# Patient Record
Sex: Female | Born: 1950 | Race: White | Hispanic: No | Marital: Married | Smoking: Current every day smoker
Health system: Southern US, Community
[De-identification: ages and names within clinical notes are randomized; demographics above are authoritative.]

## PROBLEM LIST (undated history)

## (undated) DIAGNOSIS — R569 Unspecified convulsions: Secondary | ICD-10-CM

## (undated) HISTORY — PX: KNEE SURGERY: SHX244

---

## 2017-11-09 ENCOUNTER — Emergency Department (HOSPITAL_COMMUNITY)
Admission: EM | Admit: 2017-11-09 | Discharge: 2017-11-09 | Disposition: A | Discharge status: Home / Self Care | Payer: Medicare Other | Attending: Emergency Medicine | Admitting: Emergency Medicine

## 2017-11-09 ENCOUNTER — Emergency Department (HOSPITAL_COMMUNITY): Payer: Medicare Other

## 2017-11-09 ENCOUNTER — Encounter (HOSPITAL_COMMUNITY): Payer: Self-pay | Admitting: Emergency Medicine

## 2017-11-09 ENCOUNTER — Other Ambulatory Visit: Payer: Self-pay

## 2017-11-09 DIAGNOSIS — Y999 Unspecified external cause status: Secondary | ICD-10-CM | POA: Diagnosis not present

## 2017-11-09 DIAGNOSIS — Y939 Activity, unspecified: Secondary | ICD-10-CM | POA: Diagnosis not present

## 2017-11-09 DIAGNOSIS — Y929 Unspecified place or not applicable: Secondary | ICD-10-CM | POA: Diagnosis not present

## 2017-11-09 DIAGNOSIS — T07XXXA Unspecified multiple injuries, initial encounter: Secondary | ICD-10-CM

## 2017-11-09 DIAGNOSIS — W108XXA Fall (on) (from) other stairs and steps, initial encounter: Secondary | ICD-10-CM | POA: Diagnosis not present

## 2017-11-09 DIAGNOSIS — S0990XA Unspecified injury of head, initial encounter: Secondary | ICD-10-CM | POA: Insufficient documentation

## 2017-11-09 DIAGNOSIS — W19XXXA Unspecified fall, initial encounter: Secondary | ICD-10-CM

## 2017-11-09 LAB — CBC
HCT: 35.6 % — ABNORMAL LOW (ref 36.0–46.0)
HEMOGLOBIN: 11.6 g/dL — AB (ref 12.0–15.0)
MCH: 28.6 pg (ref 26.0–34.0)
MCHC: 32.6 g/dL (ref 30.0–36.0)
MCV: 87.7 fL (ref 78.0–100.0)
Platelets: 223 10*3/uL (ref 150–400)
RBC: 4.06 MIL/uL (ref 3.87–5.11)
RDW: 17.8 % — ABNORMAL HIGH (ref 11.5–15.5)
WBC: 7.5 10*3/uL (ref 4.0–10.5)

## 2017-11-09 LAB — CBG MONITORING, ED: Glucose-Capillary: 83 mg/dL (ref 65–99)

## 2017-11-09 LAB — BASIC METABOLIC PANEL
ANION GAP: 7 (ref 5–15)
BUN: 13 mg/dL (ref 6–20)
CHLORIDE: 103 mmol/L (ref 101–111)
CO2: 26 mmol/L (ref 22–32)
Calcium: 8.5 mg/dL — ABNORMAL LOW (ref 8.9–10.3)
Creatinine, Ser: 0.6 mg/dL (ref 0.44–1.00)
GFR calc non Af Amer: >60 mL/min (ref >60)
Glucose, Bld: 94 mg/dL (ref 65–99)
Potassium: 3.5 mmol/L (ref 3.5–5.1)
Sodium: 136 mmol/L (ref 135–145)

## 2017-11-09 MED ORDER — MORPHINE SULFATE (PF) 4 MG/ML IV SOLN
4.0000 mg | Freq: Once | INTRAVENOUS | Status: AC
  Administered 2017-11-09: 4 mg via INTRAVENOUS
  Filled 2017-11-09: qty 1

## 2017-11-09 NOTE — ED Notes (Signed)
Patient transported to CT 

## 2017-11-09 NOTE — ED Notes (Signed)
Attempted blood draw without success

## 2017-11-09 NOTE — Discharge Instructions (Signed)
You can take tylenol or motrin for pain. Follow-up with your orthopedist if ongoing issues with your shoulder. Can also follow-up with your primary care doctor. Return to the ED for new or worsening symptoms.

## 2017-11-09 NOTE — ED Notes (Signed)
Pt given taxi voucher d/t EMS arrival without wallet or other transportation. Pt departed in NAD.
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
## 2017-11-09 NOTE — ED Notes (Signed)
Patient transported to X-ray 

## 2017-11-09 NOTE — ED Triage Notes (Signed)
Per [facility], pt arrives from home after slipping on front porch steps and falling onto asphault. Pt reports loss of consciousness for 5 minutes. Pt is on plavix. Pt arrives in c-collar and sling to left arm. EMS reports bump to occipital region and bruising to left shoulder and clavicle. EMS reports nausea and gave 4 mg of zofran. EMS gave 150 mcg of fentanyl.

## 2018-05-02 ENCOUNTER — Emergency Department (HOSPITAL_BASED_OUTPATIENT_CLINIC_OR_DEPARTMENT_OTHER): Payer: Medicare Other

## 2018-05-02 ENCOUNTER — Encounter (HOSPITAL_BASED_OUTPATIENT_CLINIC_OR_DEPARTMENT_OTHER): Payer: Self-pay | Admitting: Emergency Medicine

## 2018-05-02 ENCOUNTER — Other Ambulatory Visit: Payer: Self-pay

## 2018-05-02 ENCOUNTER — Emergency Department (HOSPITAL_BASED_OUTPATIENT_CLINIC_OR_DEPARTMENT_OTHER)
Admission: EM | Admit: 2018-05-02 | Discharge: 2018-05-02 | Disposition: A | Discharge status: Home / Self Care | Payer: Medicare Other | Attending: Emergency Medicine | Admitting: Emergency Medicine

## 2018-05-02 DIAGNOSIS — S2231XA Fracture of one rib, right side, initial encounter for closed fracture: Secondary | ICD-10-CM | POA: Diagnosis not present

## 2018-05-02 DIAGNOSIS — Y9389 Activity, other specified: Secondary | ICD-10-CM | POA: Diagnosis not present

## 2018-05-02 DIAGNOSIS — F17210 Nicotine dependence, cigarettes, uncomplicated: Secondary | ICD-10-CM | POA: Insufficient documentation

## 2018-05-02 DIAGNOSIS — Y929 Unspecified place or not applicable: Secondary | ICD-10-CM | POA: Diagnosis not present

## 2018-05-02 DIAGNOSIS — W01190A Fall on same level from slipping, tripping and stumbling with subsequent striking against furniture, initial encounter: Secondary | ICD-10-CM | POA: Insufficient documentation

## 2018-05-02 DIAGNOSIS — Y998 Other external cause status: Secondary | ICD-10-CM | POA: Insufficient documentation

## 2018-05-02 DIAGNOSIS — Z79899 Other long term (current) drug therapy: Secondary | ICD-10-CM | POA: Insufficient documentation

## 2018-05-02 DIAGNOSIS — S299XXA Unspecified injury of thorax, initial encounter: Secondary | ICD-10-CM | POA: Diagnosis present

## 2018-05-02 DIAGNOSIS — Z7902 Long term (current) use of antithrombotics/antiplatelets: Secondary | ICD-10-CM | POA: Insufficient documentation

## 2018-05-02 MED ORDER — OXYCODONE-ACETAMINOPHEN 5-325 MG PO TABS: 1.0000 | ORAL_TABLET | Freq: Four times a day (QID) | ORAL | 0 refills | Status: DC | PRN

## 2018-05-02 MED ORDER — OXYCODONE-ACETAMINOPHEN 5-325 MG PO TABS
1.0000 | ORAL_TABLET | Freq: Once | ORAL | Status: AC
Start: 2018-05-02 — End: 2018-05-02
  Administered 2018-05-02: 1 via ORAL
  Filled 2018-05-02: qty 1

## 2018-05-02 MED ORDER — IPRATROPIUM-ALBUTEROL 0.5-2.5 (3) MG/3ML IN SOLN
3.0000 mL | Freq: Once | RESPIRATORY_TRACT | Status: AC
  Administered 2018-05-02: 3 mL via RESPIRATORY_TRACT
  Filled 2018-05-02: qty 3

## 2018-05-02 MED ORDER — ALBUTEROL SULFATE (2.5 MG/3ML) 0.083% IN NEBU
2.5000 mg | INHALATION_SOLUTION | Freq: Once | RESPIRATORY_TRACT | Status: AC
  Administered 2018-05-02: 2.5 mg via RESPIRATORY_TRACT
  Filled 2018-05-02: qty 3

## 2018-05-02 NOTE — ED Notes (Signed)
Provider at the bedside.  

## 2018-05-02 NOTE — ED Notes (Signed)
Updated pt to wait time 

## 2018-05-02 NOTE — ED Notes (Signed)
Pt/family verbalized understanding of discharge instructions.   

## 2018-05-02 NOTE — ED Triage Notes (Signed)
Pt states she fell on Saturday. She states she stumbled because she is wearing a knee immobilizer. C/o R rib pain.

## 2018-05-02 NOTE — Discharge Instructions (Addendum)
Get help right away if: °You have a fever. °You have difficulty breathing or shortness of breath. °You develop a continual cough, or you cough up thick or bloody sputum. °You feel sick to your stomach (nausea), throw up (vomit), or have abdominal pain. °You have worsening pain not controlled with medications. °
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
## 2019-06-29 IMAGING — CR DG RIBS W/ CHEST 3+V*L*
4 series · 4 of 4 positions shown · non-contrast
Comparison: None

CLINICAL DATA: 67-year-old female with fall and back pain.

EXAM:
THORACIC SPINE 2 VIEWS; LEFT RIBS AND CHEST - 3+ VIEW

[rib ap]
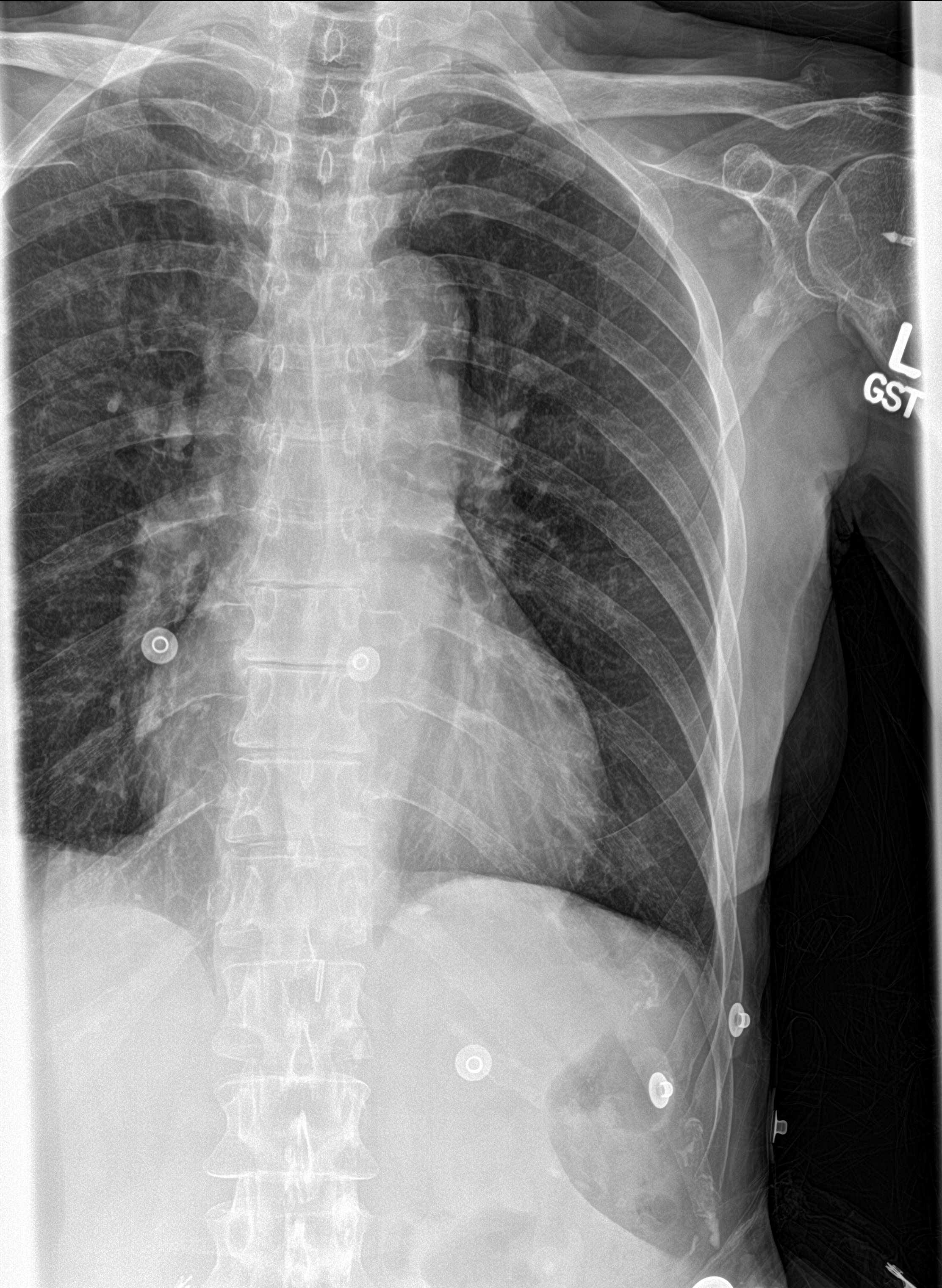

[rib ap obl (1 of 2)]
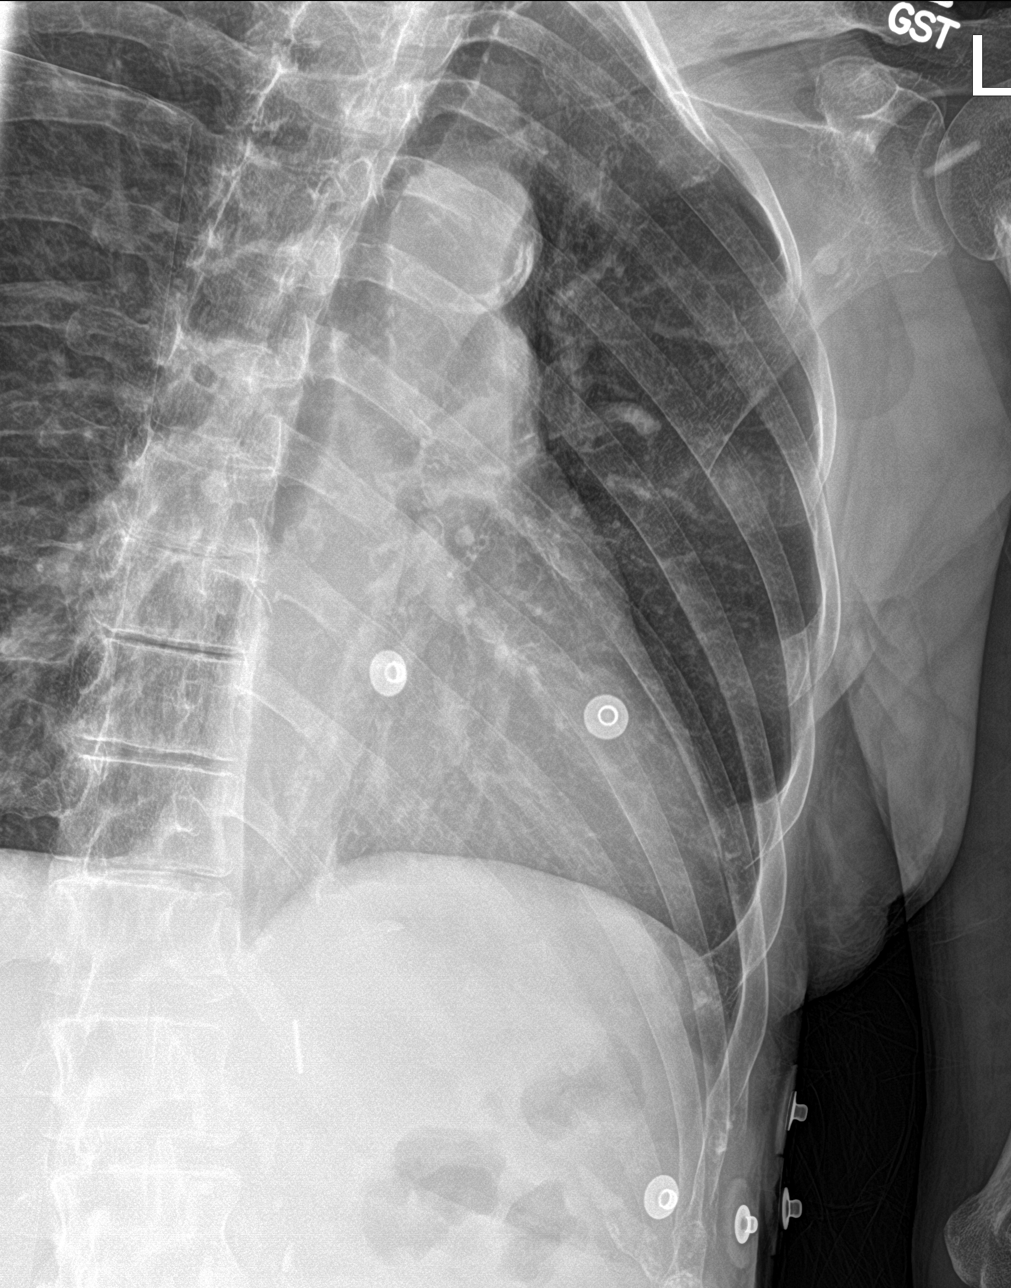

[chest ap]
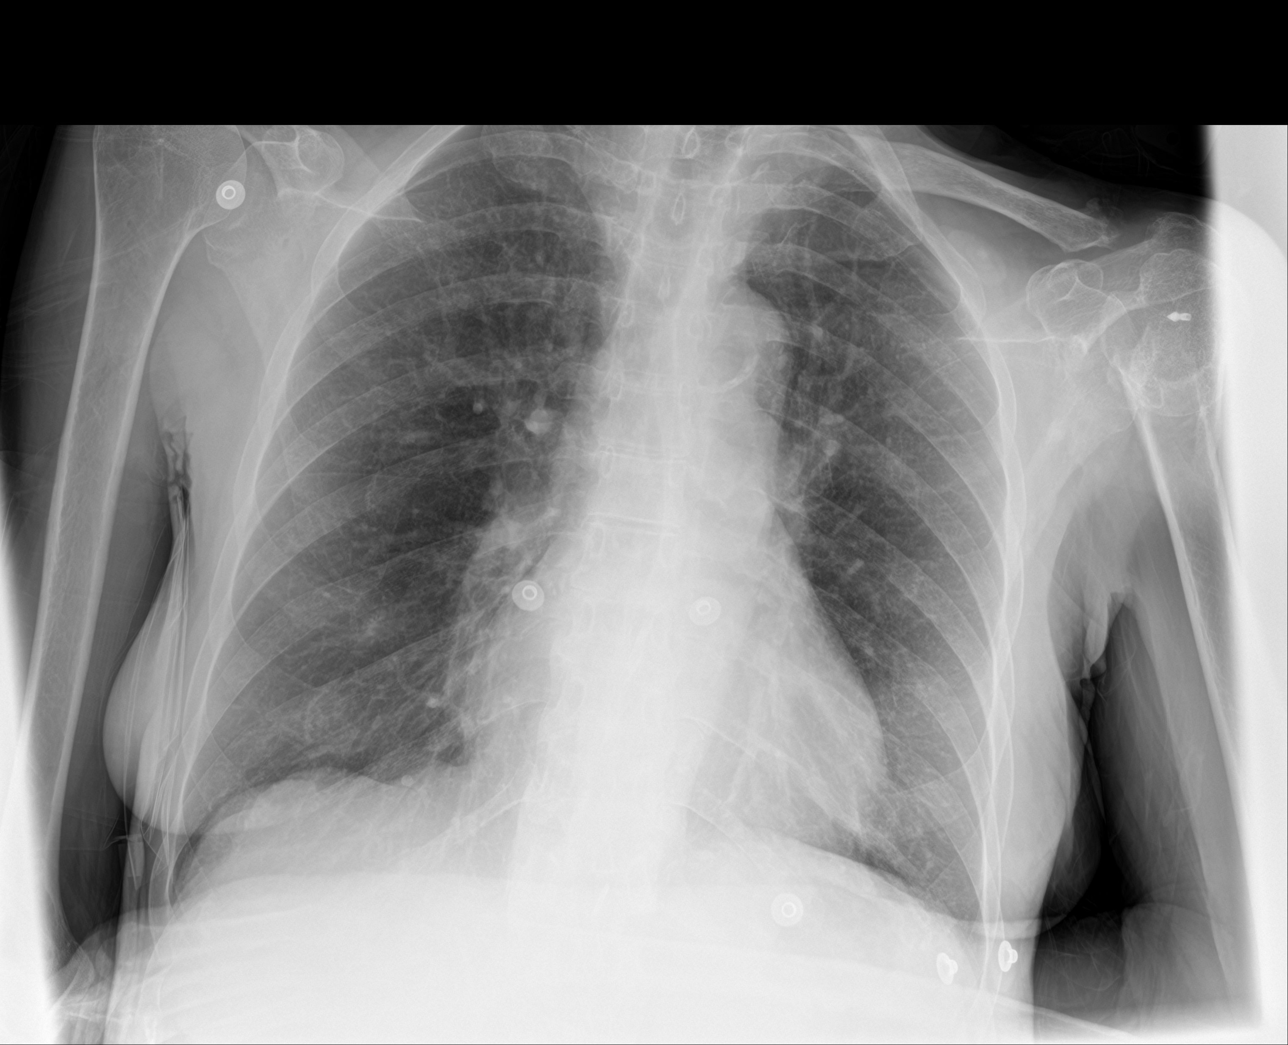

[rib ap obl (2 of 2)]
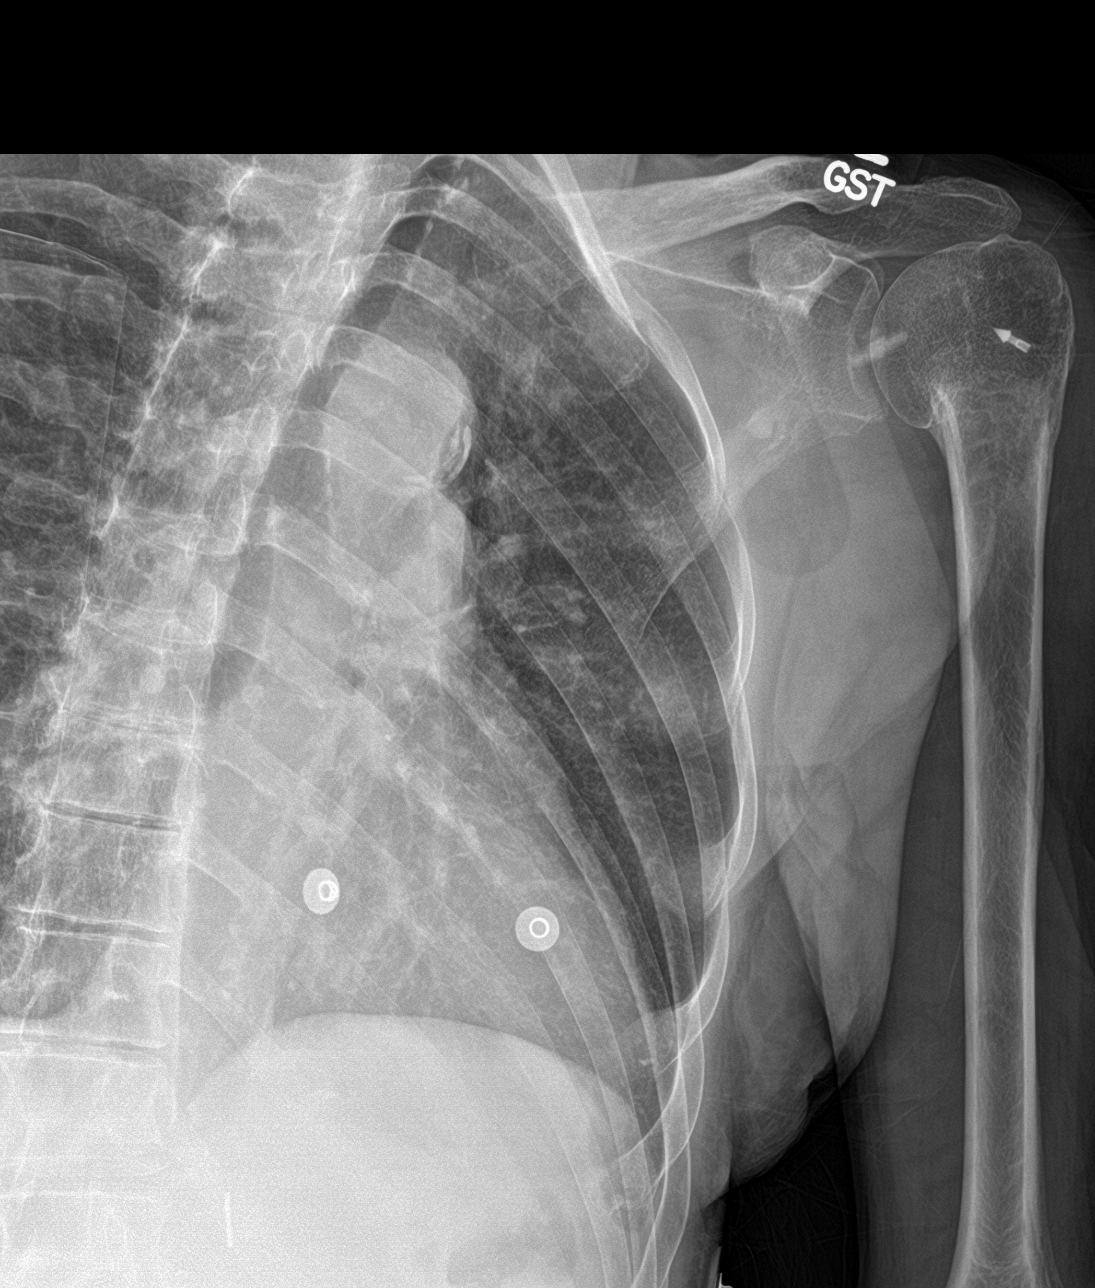

[4 of 4 positions shown; findings below may reference images not displayed]

FINDINGS: The lungs are clear. There is no pleural effusion or pneumothorax.
The cardiac silhouette is within normal limits. There is
atherosclerotic calcification of the aortic arch. There is
osteopenia with degenerative changes of the spine. No acute osseous
pathology identified. No acute thoracic spine or left rib fracture.
IMPRESSION: 1. No acute cardiopulmonary process.
2. No acute fracture.

## 2020-01-22 ENCOUNTER — Emergency Department (HOSPITAL_BASED_OUTPATIENT_CLINIC_OR_DEPARTMENT_OTHER): Payer: Medicare (Managed Care)

## 2020-01-22 ENCOUNTER — Emergency Department (HOSPITAL_BASED_OUTPATIENT_CLINIC_OR_DEPARTMENT_OTHER)
Admission: EM | Admit: 2020-01-22 | Discharge: 2020-01-22 | Disposition: A | Discharge status: Home / Self Care | Payer: Medicare (Managed Care) | Attending: Emergency Medicine | Admitting: Emergency Medicine

## 2020-01-22 ENCOUNTER — Encounter (HOSPITAL_BASED_OUTPATIENT_CLINIC_OR_DEPARTMENT_OTHER): Payer: Self-pay | Admitting: *Deleted

## 2020-01-22 ENCOUNTER — Other Ambulatory Visit: Payer: Self-pay

## 2020-01-22 DIAGNOSIS — L249 Irritant contact dermatitis, unspecified cause: Secondary | ICD-10-CM

## 2020-01-22 DIAGNOSIS — M79602 Pain in left arm: Secondary | ICD-10-CM | POA: Diagnosis present

## 2020-01-22 DIAGNOSIS — L03114 Cellulitis of left upper limb: Secondary | ICD-10-CM

## 2020-01-22 DIAGNOSIS — F17210 Nicotine dependence, cigarettes, uncomplicated: Secondary | ICD-10-CM | POA: Diagnosis not present

## 2020-01-22 MED ORDER — TRIAMCINOLONE ACETONIDE 0.1 % EX CREA: 1.0000 "application " | TOPICAL_CREAM | Freq: Two times a day (BID) | CUTANEOUS | 0 refills | Status: AC

## 2020-01-22 MED ORDER — HYDROCODONE-ACETAMINOPHEN 5-325 MG PO TABS
1.0000 | ORAL_TABLET | Freq: Once | ORAL | Status: AC
  Administered 2020-01-22: 1 via ORAL
  Filled 2020-01-22: qty 1

## 2020-01-22 MED ORDER — CEPHALEXIN 500 MG PO CAPS: 500.0000 mg | ORAL_CAPSULE | Freq: Four times a day (QID) | ORAL | 0 refills | Status: AC

## 2020-01-22 MED ORDER — HYDROCODONE-ACETAMINOPHEN 5-325 MG PO TABS: 1.0000 | ORAL_TABLET | ORAL | 0 refills | Status: DC | PRN

## 2020-01-22 NOTE — ED Notes (Signed)
Pt discharged to home. Discharge instructions have been discussed with patient and/or family members. Pt verbally acknowledges understanding d/c instructions, and endorses comprehension to checkout at registration before leaving.  °

## 2020-01-22 NOTE — Discharge Instructions (Addendum)
As we discussed, there may be some contact dermatitis from Neosporin.  We will plan to put you on a topical steroid.  Do not put the steroid cream over the wound.  Additionally, there may be some superficial skin infection. Take antibiotics as directed. Please take all of your antibiotics until finished.  Return the emergency department for any fever, redness or swelling that began spreading, difficulty moving the arm or leg worsening or concerning symptoms.
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
## 2020-01-22 NOTE — ED Provider Notes (Signed)
[facility] EMERGENCY DEPARTMENT Provider Note   CSN: [id number] Arrival date & time: 01/22/20  1517     History Chief Complaint  Patient presents with  . Arm Injury    Kimberly Roberson is a 69 y.o. female who complains of 1 week of redness, swelling, pain noted to her left upper extremity.  She reports that about a week ago, she was helping her husband and states that her arm got caught in between the door.  She thinks that 1 part of the door scratched her arm.  She reports she started having some bruising, swelling to the area.  She reports that over the last few days, it is gotten red, warm to the touch and she has noted some blisters.  She states that it hurts more when she tries to move it.  She has not noted any fevers.  Denies any numbness/weakness.  She is not on any blood thinners. No prior history of DVTs.   The history is provided by the patient.       History reviewed. No pertinent past medical history.  There are no problems to display for this patient.   History reviewed. No pertinent surgical history.   OB History   No obstetric history on file.     No family history on file.  Social History   Tobacco Use  . Smoking status: Current Every Day Smoker    Packs/day: 0.50    Types: Cigarettes  . Smokeless tobacco: Never Used  Substance Use Topics  . Alcohol use: Not on file  . Drug use: Never    Home Medications Prior to Admission medications   Medication Sig Start Date End Date Taking? Authorizing Provider  atorvastatin (LIPITOR) 10 MG tablet Take 10 mg by mouth daily. 10/19/17   [provider]  carbamazepine (TEGRETOL) 200 MG tablet Take 200-400 mg by mouth See admin instructions. Take 1 tablet every morning and take 2 tablets every evening 08/09/14   [provider]  cephALEXin (KEFLEX) 500 MG capsule Take 1 capsule (500 mg total) by mouth 4 (four) times daily for 7 days. 01/22/20 01/29/20  Maxwell Caul, PA-C  clopidogrel  (PLAVIX) 75 MG tablet Take 75 mg by mouth daily. 10/10/17   [provider]  divalproex (DEPAKOTE) 500 MG DR tablet Take 1,000-1,500 mg by mouth See admin instructions. Take 2 tablets every morning and take 3 tablets every evening 10/09/12   [provider]  HYDROcodone-acetaminophen (NORCO/VICODIN) 5-325 MG tablet Take 1-2 tablets by mouth every 4 (four) hours as needed. 01/22/20   Maxwell Caul, PA-C  oxyCODONE-acetaminophen (PERCOCET) 5-325 MG tablet Take 1-2 tablets by mouth every 6 (six) hours as needed for severe pain. 05/02/18   Arthor Captain, PA-C  promethazine (PHENERGAN) 25 MG tablet Take 25 mg by mouth 3 (three) times daily as needed for nausea.  11/08/17   [provider]  triamcinolone cream (KENALOG) 0.1 % Apply 1 application topically 2 (two) times daily. 01/22/20   Maxwell Caul, PA-C    Allergies    Bee venom, Demerol  [meperidine hcl], Meperidine, Morphine and related, Sulfa antibiotics, Clarithromycin, and Codeine  Review of Systems   Review of Systems  Constitutional: Negative for fever.  Respiratory: Negative for shortness of breath.   Cardiovascular: Negative for chest pain.  Gastrointestinal: Negative for abdominal pain, nausea and vomiting.  Skin: Positive for color change and wound.  Neurological: Negative for weakness and numbness.  All other systems reviewed and are negative.  Physical Exam Updated Vital Signs BP (!) 141/60 (BP Location: Right Arm)   Pulse 93   Temp 98.6 F (37 C) (Oral)   Resp 19   Wt 54.9 kg   SpO2 100%   BMI 20.15 kg/m   Physical Exam Vitals and nursing note reviewed.  Constitutional:      Appearance: She is well-developed.  HENT:     Head: Normocephalic and atraumatic.  Eyes:     General: No scleral icterus.       Right eye: No discharge.        Left eye: No discharge.     Conjunctiva/sclera: Conjunctivae normal.  Cardiovascular:     Pulses:          Radial pulses are 2+ on the right side and  2+ on the left side.  Pulmonary:     Effort: Pulmonary effort is normal.  Musculoskeletal:     Comments: Edema, warmth, erythema, induration and ecchymosis noted to the anterior aspect of the left forearm/elbow, particular at the antecubital fossa.  She has a small 1 cm scabbed over wound.  No active drainage.  No fluctuance.  Flexion/extension of left elbow intact but with some subjective pain.  No bony tenderness noted to the left elbow joint itself.  Elbow joint is without any warmth, erythema.  Compartments are soft. No bony tenderness of the left shoulder, left forearm, left wrist.  Skin:    General: Skin is warm and dry.     Capillary Refill: Capillary refill takes less than 2 seconds.     Comments: Diffuse erythematous, maculopapular rash in the anterior aspect of left upper extremity. Good distal cap refill.  RUE is not dusky in appearance or cool to touch.  Neurological:     Mental Status: She is alert.     Comments: Sensation intact along major nerve distributions of BUE  Psychiatric:        Speech: Speech normal.        Behavior: Behavior normal.             ED Results / Procedures / Treatments   Labs (all labs ordered are listed, but only abnormal results are displayed) Labs Reviewed  BASIC METABOLIC PANEL  CBC WITH DIFFERENTIAL/PLATELET    EKG None  Radiology DG Elbow Complete Left  Result Date: 01/22/2020 CLINICAL DATA:  Left arm injury, left elbow swelling EXAM: LEFT ELBOW - COMPLETE 3+ VIEW COMPARISON:  None. FINDINGS: There is no evidence of fracture, dislocation, or joint effusion. There is no evidence of arthropathy or other focal bone abnormality. Soft tissues are unremarkable. IMPRESSION: Negative. Electronically Signed   By: Helyn Numbers MD   On: 01/22/2020 16:16    Procedures Procedures (including critical care time)  Medications Ordered in ED Medications  HYDROcodone-acetaminophen (NORCO/VICODIN) 5-325 MG per tablet 1 tablet (1 tablet Oral  Given 01/22/20 1642)    ED Course  I have reviewed the triage vital signs and the nursing notes.  Pertinent labs & imaging results that were available during my care of the patient were reviewed by me and considered in my medical decision making (see chart for details).    MDM Rules/Calculators/A&P                          69 year old female who presents for evaluation of pain, redness, swelling of the left upper extremity.  She reports that she got it caught in a door but a week ago.  Since then, has had symptoms.  No fevers.  She reports that she broke out into a rash a few days ago, prompting ED visit.  Initially arrival, she is afebrile, nontoxic-appearing.  Vital signs are stable.  She is neurovascularly intact.  On exam, she has diffuse warmth, erythema, edema to the anterior aspect of her left forearm/elbow area at the antecubital fossa.  There is a small wound that is scabbed over.  No active drainage.  No fluctuance concerning for abscess.  Additionally, she has a diffuse erythematous, maculopapular rash.  Consider cellulitis with contact dermatitis.  History/physical exam not concerning for shingles, ischemic limb, DVT of upper extremity, septic arthritis.  We will plan for x-ray for evaluation of any traumatic bony abnormality.  Elbow x-ray shows no evidence of fracture dislocation or joint effusion.  Discussed with Dr. Madilyn Hook who evaluated patient.  We will plan to treat as contact dermatitis triamcinolone as well as cellulitis.  Updated patient on plan.  She is agreeable.  She is requesting pain medication help with pain to go to sleep.  Patient reviewed on PMP.  She does have recent narcotic prescription on 12/12/2019.  None since then. At this time, patient exhibits no emergent life-threatening condition that require further evaluation in ED or admission. Patient had ample opportunity for questions and discussion. All patient's questions were answered with full understanding. Strict return  precautions discussed. Patient expresses understanding and agreement to plan.   Portions of this note were generated with Scientist, clinical (histocompatibility and immunogenetics). Dictation errors may occur despite best attempts at proofreading.  Final Clinical Impression(s) / ED Diagnoses Final diagnoses:  Cellulitis of left upper extremity  Irritant contact dermatitis, unspecified trigger    Rx / DC Orders ED Discharge Orders         Ordered    triamcinolone cream (KENALOG) 0.1 %  2 times daily     Discontinue  Reprint     01/22/20 1711    cephALEXin (KEFLEX) 500 MG capsule  4 times daily     Discontinue  Reprint     01/22/20 1711    HYDROcodone-acetaminophen (NORCO/VICODIN) 5-325 MG tablet  Every 4 hours PRN     Discontinue  Reprint     01/22/20 1711           Maxwell Caul, PA-C 01/22/20 1719    Tilden Fossa, MD 01/23/20 [phone]

## 2020-01-22 NOTE — ED Notes (Signed)
IV attempt x 1 right forearm unsuccessful, RN & MD aware.

## 2020-01-22 NOTE — ED Notes (Signed)
EDP Madilyn Hook canceling labs, and IV d/t pt being difficult stick. 4 unsuccessful IV attempts. 1 attempt by Augusto Gamble, RN,  2 attempts by Janna Arch, RN, and 1 attempt by Gwenlyn Perking, RT

## 2020-01-31 ENCOUNTER — Emergency Department (HOSPITAL_BASED_OUTPATIENT_CLINIC_OR_DEPARTMENT_OTHER): Payer: Medicare (Managed Care)

## 2020-01-31 ENCOUNTER — Emergency Department (HOSPITAL_BASED_OUTPATIENT_CLINIC_OR_DEPARTMENT_OTHER)
Admission: EM | Admit: 2020-01-31 | Discharge: 2020-01-31 | Disposition: A | Discharge status: Home / Self Care | Payer: Medicare (Managed Care) | Attending: Emergency Medicine | Admitting: Emergency Medicine

## 2020-01-31 ENCOUNTER — Other Ambulatory Visit: Payer: Self-pay

## 2020-01-31 ENCOUNTER — Encounter (HOSPITAL_BASED_OUTPATIENT_CLINIC_OR_DEPARTMENT_OTHER): Payer: Self-pay

## 2020-01-31 DIAGNOSIS — M25561 Pain in right knee: Secondary | ICD-10-CM

## 2020-01-31 DIAGNOSIS — W19XXXA Unspecified fall, initial encounter: Secondary | ICD-10-CM | POA: Insufficient documentation

## 2020-01-31 DIAGNOSIS — F17210 Nicotine dependence, cigarettes, uncomplicated: Secondary | ICD-10-CM | POA: Insufficient documentation

## 2020-01-31 HISTORY — DX: Unspecified convulsions: R56.9

## 2020-01-31 MED ORDER — PROMETHAZINE HCL 25 MG/ML IJ SOLN: 6.2500 mg | Freq: Once | INTRAMUSCULAR | Status: DC

## 2020-01-31 MED ORDER — OXYCODONE-ACETAMINOPHEN 5-325 MG PO TABS
2.0000 | ORAL_TABLET | Freq: Once | ORAL | Status: AC
  Administered 2020-01-31: 2 via ORAL
  Filled 2020-01-31: qty 2

## 2020-01-31 MED ORDER — OXYCODONE-ACETAMINOPHEN 5-325 MG PO TABS: 1.0000 | ORAL_TABLET | Freq: Four times a day (QID) | ORAL | 0 refills | Status: DC | PRN

## 2020-01-31 MED ORDER — HYDROMORPHONE HCL 1 MG/ML IJ SOLN
1.0000 mg | Freq: Once | INTRAMUSCULAR | Status: AC
  Administered 2020-01-31: 1 mg via INTRAVENOUS
  Filled 2020-01-31: qty 1

## 2020-01-31 NOTE — ED Provider Notes (Signed)
[facility] EMERGENCY DEPARTMENT Provider Note   CSN: [id number] Arrival date & time: 01/31/20  1221     History Chief Complaint  Patient presents with   Knee Injury    Kimberly Roberson is a 69 y.o. female w/ hx of tibial plateau fracture in 2020 s/p plate repair and fixation, presenting to the ED with right knee pain.  She reports she lost her balance yesterday evening and fell from standing directly onto her right knee.  She had severe pain overnight.  She presents in 10/10 pain, the worst near her right knee at the site of plate fixation.  She denies other injures.  HPI     Past Medical History:  Diagnosis Date   Seizures (HCC)     There are no problems to display for this patient.   Past Surgical History:  Procedure Laterality Date   CESAREAN SECTION     KNEE SURGERY       OB History   No obstetric history on file.     No family history on file.  Social History   Tobacco Use   Smoking status: Current Every Day Smoker    Packs/day: 0.50    Types: Cigarettes   Smokeless tobacco: Never Used  Vaping Use   Vaping Use: Never used  Substance Use Topics   Alcohol use: Not Currently   Drug use: Never    Home Medications Prior to Admission medications   Medication Sig Start Date End Date Taking? Authorizing Provider  atorvastatin (LIPITOR) 10 MG tablet Take 10 mg by mouth daily. 10/19/17   [provider]  carbamazepine (TEGRETOL) 200 MG tablet Take 200-400 mg by mouth See admin instructions. Take 1 tablet every morning and take 2 tablets every evening 08/09/14   [provider]  clopidogrel (PLAVIX) 75 MG tablet Take 75 mg by mouth daily. 10/10/17   [provider]  divalproex (DEPAKOTE) 500 MG DR tablet Take 1,000-1,500 mg by mouth See admin instructions. Take 2 tablets every morning and take 3 tablets every evening 10/09/12   [provider]  HYDROcodone-acetaminophen (NORCO/VICODIN) 5-325 MG tablet Take 1-2  tablets by mouth every 4 (four) hours as needed. 01/22/20   Maxwell Caul, PA-C  oxyCODONE-acetaminophen (PERCOCET) 5-325 MG tablet Take 1-2 tablets by mouth every 6 (six) hours as needed for severe pain. 05/02/18   Arthor Captain, PA-C  oxyCODONE-acetaminophen (PERCOCET/ROXICET) 5-325 MG tablet Take 1 tablet by mouth every 6 (six) hours as needed for up to 10 doses for severe pain. 01/31/20   Terald Sleeper, MD  promethazine (PHENERGAN) 25 MG tablet Take 25 mg by mouth 3 (three) times daily as needed for nausea.  11/08/17   [provider]  triamcinolone cream (KENALOG) 0.1 % Apply 1 application topically 2 (two) times daily. 01/22/20   Maxwell Caul, PA-C    Allergies    Bee venom, Demerol  [meperidine hcl], Meperidine, Morphine and related, Sulfa antibiotics, Clarithromycin, and Codeine  Review of Systems   Review of Systems  Constitutional: Negative for chills and fever.  Respiratory: Negative for cough and shortness of breath.   Cardiovascular: Negative for chest pain and palpitations.  Gastrointestinal: Negative for abdominal pain and vomiting.  Musculoskeletal: Positive for arthralgias and myalgias.  Skin: Negative for color change and rash.  Neurological: Negative for weakness and numbness.  All other systems reviewed and are negative.   Physical Exam Updated Vital Signs BP 128/78    Pulse 65    Temp 98.5  F (36.9 C) (Oral)    Resp 15    Ht 5\' 5"  (1.651 m)    Wt 56.2 kg    SpO2 96%    BMI 20.63 kg/m   Physical Exam Vitals and nursing note reviewed.  Constitutional:      Appearance: She is well-developed. She is not diaphoretic.  HENT:     Head: Normocephalic and atraumatic.  Eyes:     Conjunctiva/sclera: Conjunctivae normal.  Cardiovascular:     Rate and Rhythm: Normal rate and regular rhythm.     Pulses: Normal pulses.  Pulmonary:     Effort: Pulmonary effort is normal. No respiratory distress.  Musculoskeletal:     Cervical back: Neck supple.      Comments: Small effusion of medial patella No isolated ttp of the patella or tibial plateau Tenderness of medial knee overlying orthopedic device, no visible skin breakdown or lesions  Skin:    General: Skin is warm and dry.  Neurological:     General: No focal deficit present.     Mental Status: She is alert and oriented to person, place, and time.     ED Results / Procedures / Treatments   Labs (all labs ordered are listed, but only abnormal results are displayed) Labs Reviewed - No data to display  EKG None  Radiology CT Knee Right Wo Contrast  Result Date: 01/31/2020 CLINICAL DATA:  Medial right knee pain after a fall last night. History of prior fracture fixation. Initial encounter. EXAM: CT OF THE RIGHT KNEE WITHOUT CONTRAST TECHNIQUE: Multidetector CT imaging of the right knee was performed according to the standard protocol. Multiplanar CT image reconstructions were also generated. COMPARISON:  CT right knee 02/23/2019. FINDINGS: Bones/Joint/Cartilage Previously seen medial tibial plateau fracture has been fixed with plate and screws. The fracture is solidly healed and hardware is intact without loosening or other complicating feature. Position and alignment are anatomic. No acute fracture is identified. Bones appear mildly osteopenic but no focal lesion is present. There is no joint effusion. Joint spaces are preserved. Ligaments Suboptimally assessed by CT. The cruciate and collateral ligaments appear intact. Muscles and Tendons Intact and normal in appearance. Soft tissues Negative. IMPRESSION: No acute abnormality. Healed medial tibial plateau fracture with fixation hardware in place. The exam is otherwise unremarkable. Electronically Signed   By: 02/25/2019 M.D.   On: 01/31/2020 15:26   DG Knee Complete 4 Views Right  Result Date: 01/31/2020 CLINICAL DATA:  Status post fall. EXAM: RIGHT KNEE - COMPLETE 4+ VIEW COMPARISON:  None. FINDINGS: No evidence of an acute fracture  or dislocation. A radiopaque fixation plate and multiple fixation screws are seen along the medial aspect of the proximal right tibia. No evidence of arthropathy or other focal bone abnormality. A very small joint effusion is seen. IMPRESSION: 1. No evidence of acute fracture. 2. Very small joint effusion. Electronically Signed   By: 02/02/2020 M.D.   On: 01/31/2020 13:10    Procedures Procedures (including critical care time)  Medications Ordered in ED Medications  oxyCODONE-acetaminophen (PERCOCET/ROXICET) 5-325 MG per tablet 2 tablet (2 tablets Oral Given 01/31/20 1433)  HYDROmorphone (DILAUDID) injection 1 mg (1 mg Intravenous Given 01/31/20 1514)    ED Course  I have reviewed the triage vital signs and the nursing notes.  Pertinent labs & imaging results that were available during my care of the patient were reviewed by me and considered in my medical decision making (see chart for details).  69 yo female  presenting with right knee pain after fall onto knee last night.  She has a known right tibial plateau fracture with delayed healing s/p open repair last year.    Xrays today were negative for acute fracture.  Given her level of pain I proceeded with CT scan, which reported no acute fracture.  She is neurovascularly intact distally, and I do not suspect vascular injury.  Her pain was successfully controlled in the ED.  We'll discharge with a knee immobilizer, crutches (she has used these at home), a short course of pain medications, and advice for f/u with her orthopedist.  She and her husband verbalized understanding and agree with the plan.  Clinical Course as of Jan 30 1702  Thu Jan 31, 2020  1442 Pt immediately vomited after receiving oxycodone.  States she has an upset stomach.  Allergic to morphine.  We'll give 1 mg IV dilaudid   [MT]  1529 IMPRESSION: No acute abnormality.  Healed medial tibial plateau fracture with fixation hardware in place. The exam is otherwise  unremarkable.    [MT]  1555 Pt now quite sedated after dilaudid, will need to monitor for improvement prior to discharge.   [MT]    Clinical Course User Index [MT] Baileigh Modisette, Kermit Balo, MD    Final Clinical Impression(s) / ED Diagnoses Final diagnoses:  Acute pain of right knee    Rx / DC Orders ED Discharge Orders         Ordered    oxyCODONE-acetaminophen (PERCOCET/ROXICET) 5-325 MG tablet  Every 6 hours PRN     Discontinue  Reprint     01/31/20 1543           Terald Sleeper, MD 01/31/20 [phone]

## 2020-01-31 NOTE — ED Triage Notes (Signed)
Pt states she fell last night/injured right knee-to triage in w/c/crying

## 2020-01-31 NOTE — Discharge Instructions (Addendum)
Call your orthopedic doctor to schedule a follow up appointment for your injury.  You should wear your knee immobilizer and use crutches at home.  Apply ice to your knee.

## 2020-01-31 NOTE — ED Notes (Signed)
Pt vomited up the percocet. EDP made aware

## 2020-01-31 NOTE — ED Notes (Signed)
ED Provider at bedside. 

## 2020-01-31 NOTE — ED Notes (Signed)
Pt alert oriented x 4 

## 2020-02-23 ENCOUNTER — Other Ambulatory Visit: Payer: Self-pay

## 2020-02-23 ENCOUNTER — Encounter (HOSPITAL_BASED_OUTPATIENT_CLINIC_OR_DEPARTMENT_OTHER): Payer: Self-pay | Admitting: Emergency Medicine

## 2020-02-23 DIAGNOSIS — Z5321 Procedure and treatment not carried out due to patient leaving prior to being seen by health care provider: Secondary | ICD-10-CM | POA: Insufficient documentation

## 2020-02-23 DIAGNOSIS — M549 Dorsalgia, unspecified: Secondary | ICD-10-CM | POA: Diagnosis present

## 2020-02-23 NOTE — ED Triage Notes (Signed)
R low back pain radiating down leg x 4 days. Denies injury

## 2020-02-24 ENCOUNTER — Emergency Department (HOSPITAL_BASED_OUTPATIENT_CLINIC_OR_DEPARTMENT_OTHER): Payer: Medicare (Managed Care)

## 2020-02-24 ENCOUNTER — Emergency Department (HOSPITAL_BASED_OUTPATIENT_CLINIC_OR_DEPARTMENT_OTHER)
Admission: EM | Admit: 2020-02-24 | Discharge: 2020-02-24 | Disposition: A | Discharge status: Home / Self Care | Payer: Medicare (Managed Care) | Attending: Emergency Medicine | Admitting: Emergency Medicine

## 2020-02-24 NOTE — ED Notes (Signed)
Patient MIA both times she was called for imaging

## 2020-03-27 ENCOUNTER — Other Ambulatory Visit: Payer: Self-pay

## 2020-03-27 ENCOUNTER — Encounter (HOSPITAL_BASED_OUTPATIENT_CLINIC_OR_DEPARTMENT_OTHER): Payer: Self-pay | Admitting: Emergency Medicine

## 2020-03-27 ENCOUNTER — Emergency Department (HOSPITAL_BASED_OUTPATIENT_CLINIC_OR_DEPARTMENT_OTHER)
Admission: EM | Admit: 2020-03-27 | Discharge: 2020-03-27 | Disposition: A | Discharge status: Home / Self Care | Payer: Medicare (Managed Care) | Attending: Emergency Medicine | Admitting: Emergency Medicine

## 2020-03-27 DIAGNOSIS — F17210 Nicotine dependence, cigarettes, uncomplicated: Secondary | ICD-10-CM | POA: Diagnosis not present

## 2020-03-27 DIAGNOSIS — L0201 Cutaneous abscess of face: Secondary | ICD-10-CM | POA: Insufficient documentation

## 2020-03-27 DIAGNOSIS — H60501 Unspecified acute noninfective otitis externa, right ear: Secondary | ICD-10-CM | POA: Insufficient documentation

## 2020-03-27 DIAGNOSIS — H9201 Otalgia, right ear: Secondary | ICD-10-CM | POA: Diagnosis present

## 2020-03-27 MED ORDER — HYDROCODONE-ACETAMINOPHEN 5-325 MG PO TABS: 1.0000 | ORAL_TABLET | ORAL | 0 refills | Status: AC | PRN

## 2020-03-27 MED ORDER — PROMETHAZINE HCL 25 MG PO TABS: 25.0000 mg | ORAL_TABLET | Freq: Four times a day (QID) | ORAL | 0 refills | Status: AC | PRN

## 2020-03-27 MED ORDER — NEOMYCIN-POLYMYXIN-HC 3.5-10000-1 OT SOLN
3.0000 [drp] | Freq: Four times a day (QID) | OTIC | 0 refills | Status: AC
Start: 2020-03-27 — End: 2020-04-06

## 2020-03-27 MED ORDER — HYDROMORPHONE HCL 1 MG/ML IJ SOLN
0.5000 mg | Freq: Once | INTRAMUSCULAR | Status: AC
  Administered 2020-03-27: 0.5 mg via INTRAMUSCULAR
  Filled 2020-03-27: qty 1

## 2020-03-27 MED ORDER — SULFAMETHOXAZOLE-TRIMETHOPRIM 800-160 MG PO TABS
1.0000 | ORAL_TABLET | Freq: Two times a day (BID) | ORAL | 0 refills | Status: AC
Start: 2020-03-27 — End: ?

## 2020-03-27 NOTE — ED Triage Notes (Signed)
Reports lower lip skin infection x 3 days ,  Obvious swelling and redness , unable to wear lower teeth due to pain . Also  reports right earache x 1 day.

## 2020-03-27 NOTE — ED Provider Notes (Signed)
[facility] EMERGENCY DEPARTMENT Provider Note   CSN: [id number] Arrival date & time: 03/27/20  7829     History Chief Complaint  Patient presents with  . Otalgia  . Abscess    Kimberly Roberson is a 69 y.o. female.  Pt complains of a swollen painful area on her face.  Pt also complains of severe pain right ear.   The history is provided by the patient. No language interpreter was used.  Otalgia Location:  Right Quality:  Aching Severity:  Severe Duration:  2 days Timing:  Constant Progression:  Worsening Chronicity:  New Relieved by:  Nothing Worsened by:  Nothing Ineffective treatments:  None tried Associated symptoms: no fever   Abscess Associated symptoms: no fever        Past Medical History:  Diagnosis Date  . Seizures (HCC)     There are no problems to display for this patient.   Past Surgical History:  Procedure Laterality Date  . CESAREAN SECTION    . KNEE SURGERY       OB History   No obstetric history on file.     No family history on file.  Social History   Tobacco Use  . Smoking status: Current Every Day Smoker    Packs/day: 0.50    Types: Cigarettes  . Smokeless tobacco: Never Used  Vaping Use  . Vaping Use: Never used  Substance Use Topics  . Alcohol use: Not Currently  . Drug use: Never    Home Medications Prior to Admission medications   Medication Sig Start Date End Date Taking? Authorizing Provider  atorvastatin (LIPITOR) 10 MG tablet Take 10 mg by mouth daily. 10/19/17   [provider]  carbamazepine (TEGRETOL) 200 MG tablet Take 200-400 mg by mouth See admin instructions. Take 1 tablet every morning and take 2 tablets every evening 08/09/14   [provider]  clopidogrel (PLAVIX) 75 MG tablet Take 75 mg by mouth daily. 10/10/17   [provider]  divalproex (DEPAKOTE) 500 MG DR tablet Take 1,000-1,500 mg by mouth See admin instructions. Take 2 tablets every morning and take 3 tablets  every evening 10/09/12   [provider]  HYDROcodone-acetaminophen (NORCO/VICODIN) 5-325 MG tablet Take 1 tablet by mouth every 4 (four) hours as needed for moderate pain. 03/27/20 03/27/21  Elson Areas, PA-C  neomycin-polymyxin-hydrocortisone (CORTISPORIN) OTIC solution Place 3 drops into the right ear 4 (four) times daily for 10 days. 03/27/20 04/06/20  Elson Areas, PA-C  promethazine (PHENERGAN) 25 MG tablet Take 1 tablet (25 mg total) by mouth every 6 (six) hours as needed for nausea or vomiting. 03/27/20   Elson Areas, PA-C  sulfamethoxazole-trimethoprim (BACTRIM DS) 800-160 MG tablet Take 1 tablet by mouth 2 (two) times daily. 03/27/20   Elson Areas, PA-C  triamcinolone cream (KENALOG) 0.1 % Apply 1 application topically 2 (two) times daily. 01/22/20   Maxwell Caul, PA-C    Allergies    Bee venom, Demerol  [meperidine hcl], Meperidine, Morphine and related, Sulfa antibiotics, Clarithromycin, and Codeine  Review of Systems   Review of Systems  Constitutional: Negative for fever.  HENT: Positive for ear pain.   All other systems reviewed and are negative.   Physical Exam Updated Vital Signs BP (!) 170/66   Pulse 84   Temp 98.6 F (37 C) (Oral)   Resp 18   SpO2 98%   Physical Exam Vitals and nursing note reviewed.  Constitutional:  Appearance: Normal appearance. She is well-developed.  HENT:     Head: Normocephalic.     Comments: 1cm red swollen area with dark center mid chin     Ears:     Comments: Swollen right ear canal, painful to examine.  (obscurred by swelling)     Nose: Nose normal.     Mouth/Throat:     Mouth: Mucous membranes are moist.  Eyes:     Pupils: Pupils are equal, round, and reactive to light.  Cardiovascular:     Rate and Rhythm: Normal rate.  Pulmonary:     Effort: Pulmonary effort is normal.  Abdominal:     General: Abdomen is flat. There is no distension.  Musculoskeletal:        General: Normal range of motion.      Cervical back: Normal range of motion.  Skin:    General: Skin is warm.  Neurological:     General: No focal deficit present.     Mental Status: She is alert and oriented to person, place, and time.  Psychiatric:        Mood and Affect: Mood normal.     ED Results / Procedures / Treatments   Labs (all labs ordered are listed, but only abnormal results are displayed) Labs Reviewed - No data to display  EKG None  Radiology No results found.  Procedures Procedures (including critical care time)  Medications Ordered in ED Medications  HYDROmorphone (DILAUDID) injection 0.5 mg (0.5 mg Intramuscular Given 03/27/20 1040)    ED Course  I have reviewed the triage vital signs and the nursing notes.  Pertinent labs & imaging results that were available during my care of the patient were reviewed by me and considered in my medical decision making (see chart for details).    MDM Rules/Calculators/A&P                          MDM:  Pt request a shot of dilaudid.  Pt advised to follow up with her MD for recheck  Final Clinical Impression(s) / ED Diagnoses Final diagnoses:  Acute otitis externa of right ear, unspecified type  Facial abscess    Rx / DC Orders ED Discharge Orders         Ordered    promethazine (PHENERGAN) 25 MG tablet  Every 6 hours PRN        03/27/20 1035    HYDROcodone-acetaminophen (NORCO/VICODIN) 5-325 MG tablet  Every 4 hours PRN        03/27/20 1035    sulfamethoxazole-trimethoprim (BACTRIM DS) 800-160 MG tablet  2 times daily        03/27/20 1035    neomycin-polymyxin-hydrocortisone (CORTISPORIN) OTIC solution  4 times daily        03/27/20 1035        An After Visit Summary was printed and given to the patient.    Osie Cheeks 03/27/20 1224    Gwyneth Sprout, MD 03/27/20 1236

## 2021-09-10 IMAGING — DX DG ELBOW COMPLETE 3+V*L*
4 series · 4 of 4 positions shown · non-contrast
Comparison: None.

CLINICAL DATA: Left arm injury, left elbow swelling

EXAM:
LEFT ELBOW - COMPLETE 3+ VIEW

[elbow ap]
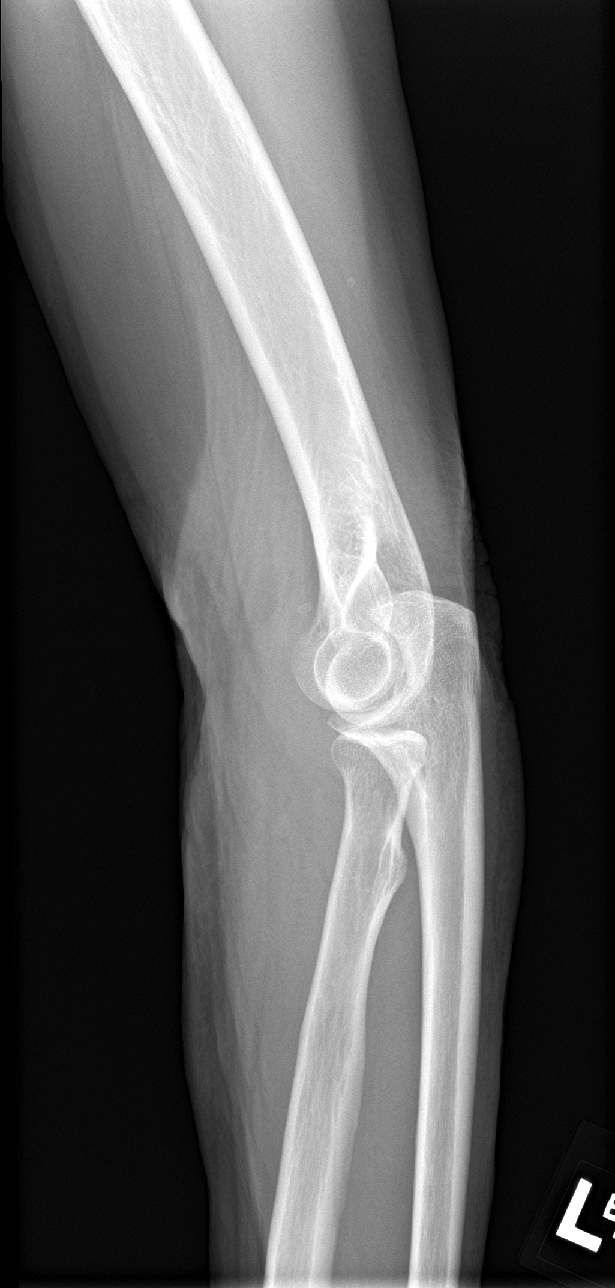

[elbow obl (1 of 2)]
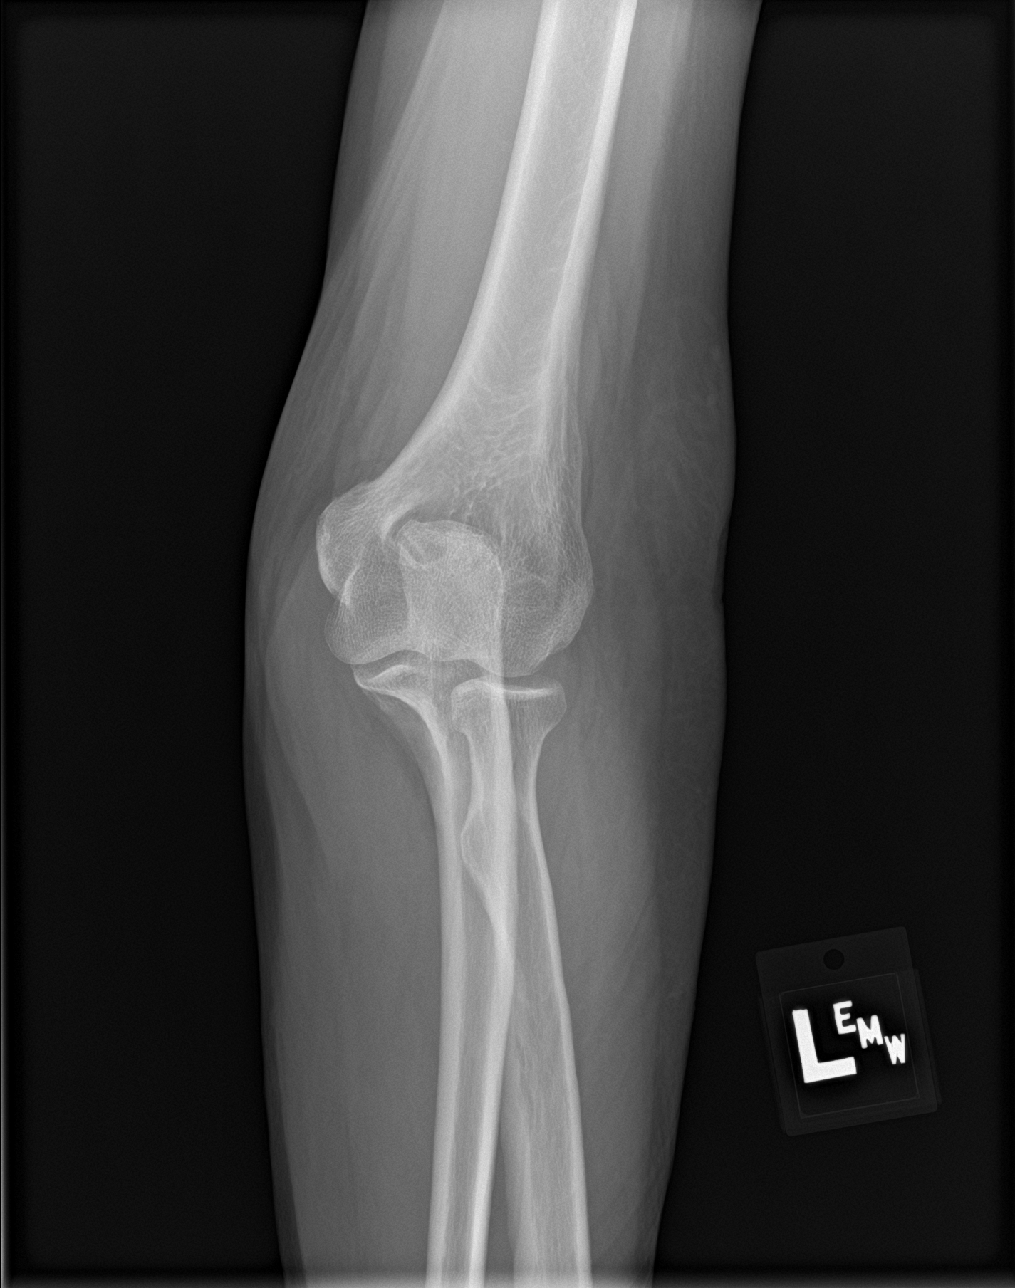

[elbow obl (2 of 2)]
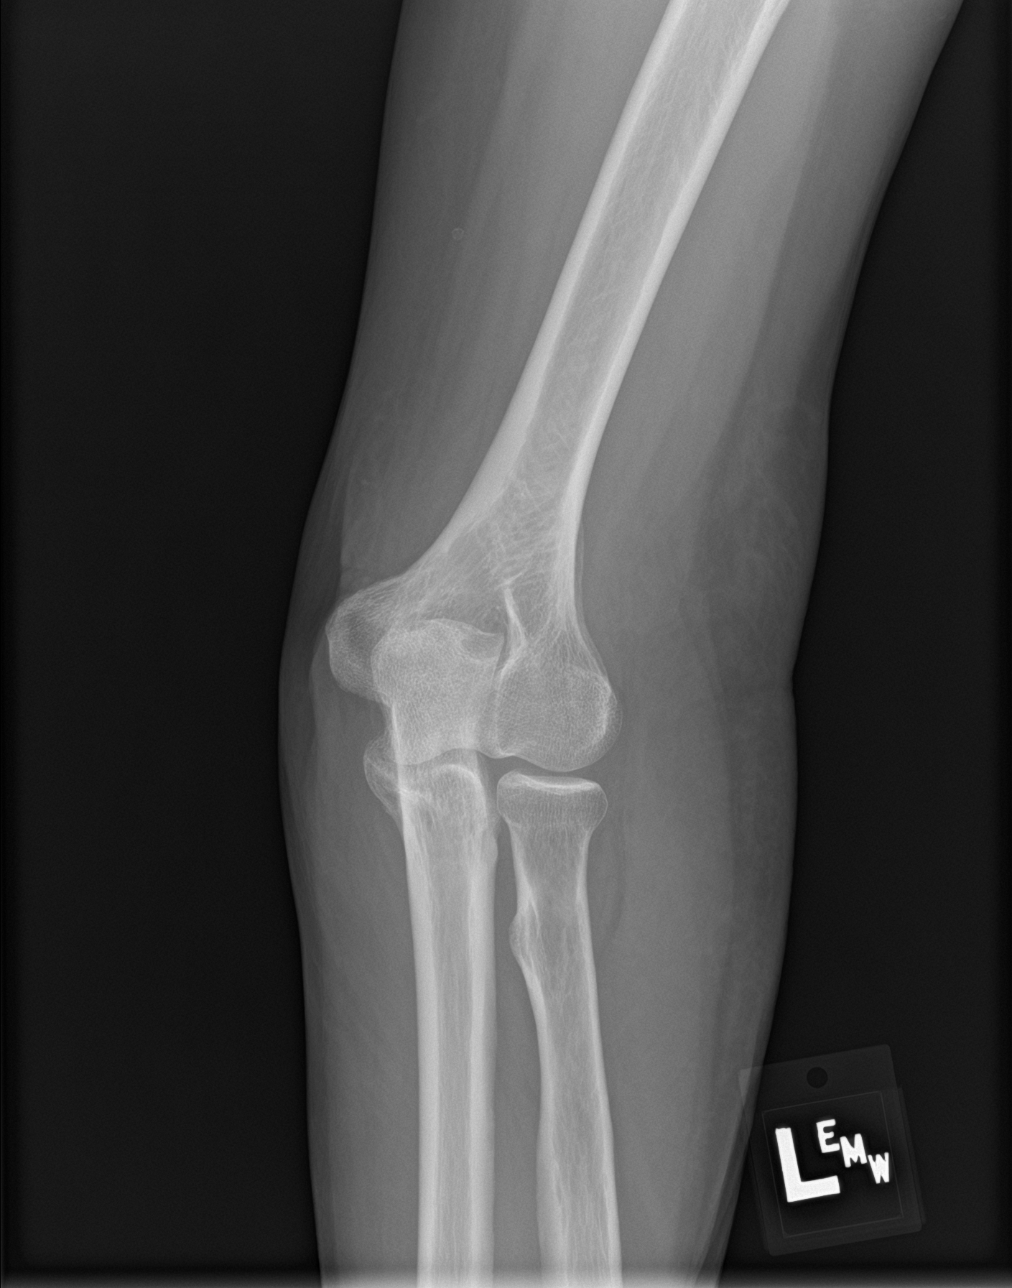

[elbow lat]
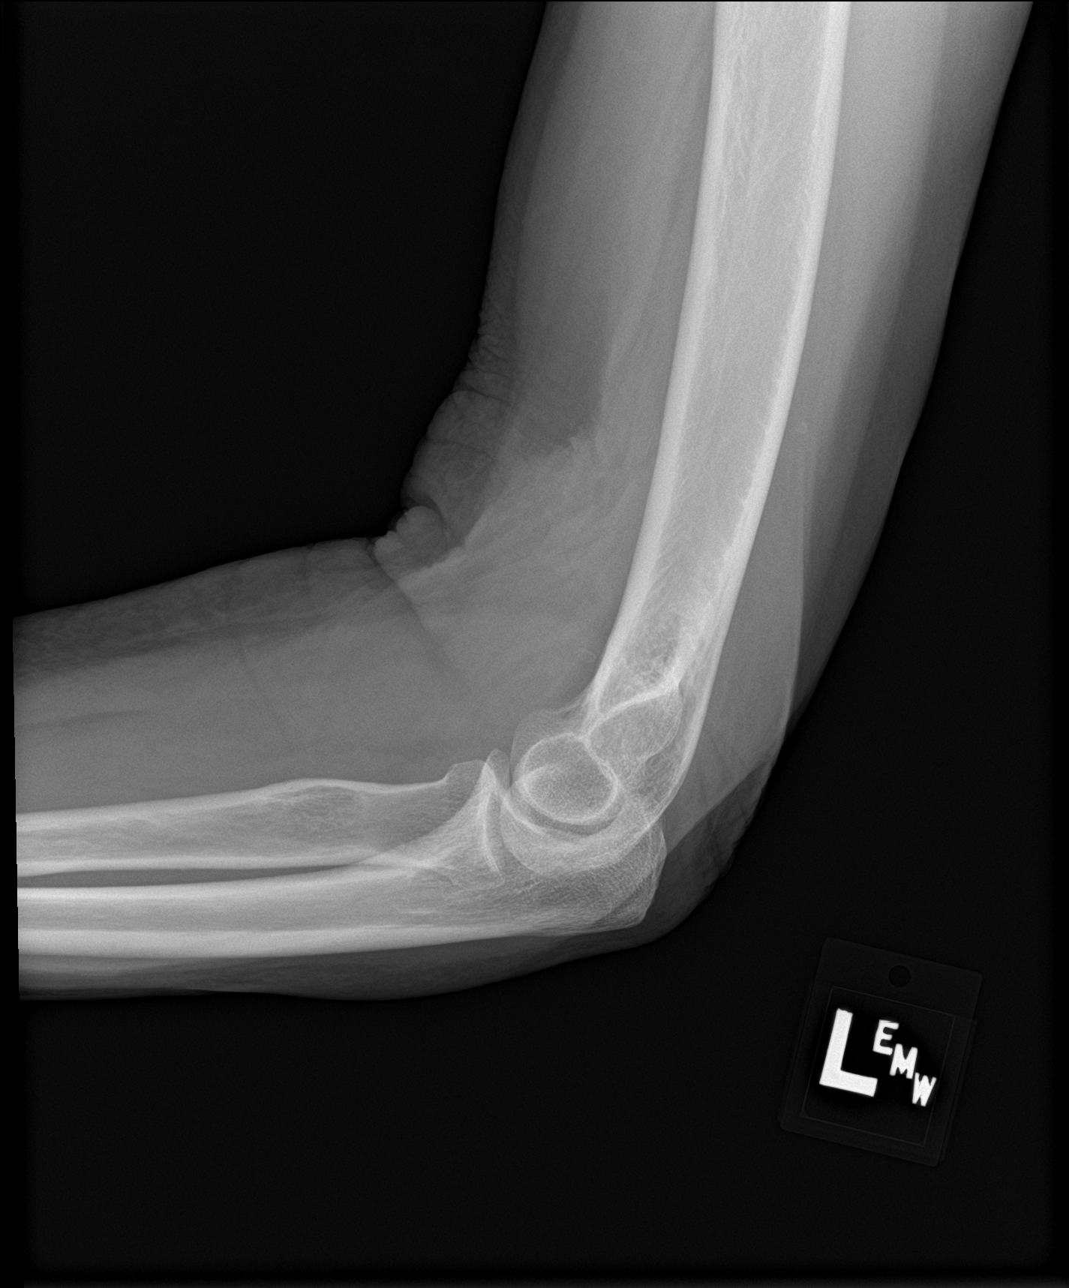

[4 of 4 positions shown; findings below may reference images not displayed]

FINDINGS: There is no evidence of fracture, dislocation, or joint effusion.
There is no evidence of arthropathy or other focal bone abnormality.
Soft tissues are unremarkable.
IMPRESSION: Negative.
